# Patient Record
Sex: Male | Born: 1990 | Race: White | Hispanic: No | Marital: Single | Smoking: Current every day smoker
Health system: Southern US, Community
[De-identification: ages and names within clinical notes are randomized; demographics above are authoritative.]

## PROBLEM LIST (undated history)

## (undated) DIAGNOSIS — N200 Calculus of kidney: Secondary | ICD-10-CM

---
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
## 2018-07-15 ENCOUNTER — Ambulatory Visit
Admission: EM | Admit: 2018-07-15 | Discharge: 2018-07-15 | Disposition: A | Discharge status: Home / Self Care | Payer: Self-pay | Attending: Family Medicine | Admitting: Family Medicine

## 2018-07-15 ENCOUNTER — Encounter: Payer: Self-pay | Admitting: Emergency Medicine

## 2018-07-15 ENCOUNTER — Other Ambulatory Visit: Payer: Self-pay

## 2018-07-15 DIAGNOSIS — L732 Hidradenitis suppurativa: Secondary | ICD-10-CM

## 2018-07-15 DIAGNOSIS — L409 Psoriasis, unspecified: Secondary | ICD-10-CM

## 2018-07-15 HISTORY — DX: Calculus of kidney: N20.0

## 2018-07-15 MED ORDER — BETAMETHASONE DIPROPIONATE 0.05 % EX OINT: TOPICAL_OINTMENT | Freq: Two times a day (BID) | CUTANEOUS | 0 refills | Status: AC

## 2018-07-15 MED ORDER — DOXYCYCLINE HYCLATE 100 MG PO CAPS: 100.0000 mg | ORAL_CAPSULE | Freq: Two times a day (BID) | ORAL | 0 refills | Status: AC

## 2018-07-15 NOTE — ED Triage Notes (Signed)
Patient c/o rash under his right arm x 5 months. States the rash is painful and is draining yellow drainage. He also c/o rash to his left lower leg x 2 years and abdomen that just started within the last month.

## 2018-07-15 NOTE — Discharge Instructions (Signed)
Take the oral antibiotic as prescribed.  Use the topical as prescribed.  Consider seeing Dermatology (I recommend [facility]).  Take care  Dr. Adriana Simas

## 2018-07-15 NOTE — ED Provider Notes (Signed)
[facility]    CSN: [id number] Arrival date & time: 07/15/18  4098  History   Chief Complaint Chief Complaint  Patient presents with  . Rash    HPI  27 year old male presents with frequent boils in his axilla.  Additionally, patient complains of rash.  Both of these conditions are chronic.  Patient reports boils in the axilla.  This is chronic.  Has been going on for months.  The right axilla is currently quite bothersome.  He has areas which are red, and draining.  Also affects the left axilla.  Additionally, patient has several areas of erythema and scaly rash.  Has a prior diagnosis of psoriasis.  Was previously treated.  Does not appear to have followed up with [facility] dermatology.  No fever or chills.  No other associated symptoms.  No other complaints.  History reviewed and updated as below. Past Medical History:  Diagnosis Date  . Kidney stone   Psoriasis  History reviewed. No pertinent surgical history.   Home Medications    Prior to Admission medications   Medication Sig Start Date End Date Taking? Authorizing Provider  betamethasone dipropionate (DIPROLENE) 0.05 % ointment Apply topically 2 (two) times daily. 07/15/18   Tommie Sams, DO  doxycycline (VIBRAMYCIN) 100 MG capsule Take 1 capsule (100 mg total) by mouth 2 (two) times daily for 14 days. 07/15/18 07/29/18  Tommie Sams, DO   Social History Social History   Tobacco Use  . Smoking status: Current Every Day Smoker  . Smokeless tobacco: Never Used  Substance Use Topics  . Alcohol use: Yes  . Drug use: Yes    Comment: states he takes pain pills occasionally   Allergies   Sulfa antibiotics   Review of Systems Review of Systems  Constitutional: Negative for fever.  Skin: Positive for rash.       Abscesses (axilla)   Physical Exam Triage Vital Signs ED Triage Vitals  Enc Vitals Group     BP 07/15/18 0834 (!) 151/109     Pulse Rate 07/15/18 0834 (!) 121     Resp 07/15/18 0834 16     Temp  07/15/18 0834 98.5 F (36.9 C)     Temp Source 07/15/18 0834 Oral     SpO2 07/15/18 0834 96 %     Weight 07/15/18 0830 183 lb 3.2 oz (83.1 kg)     Height 07/15/18 0830 5\' 9"  (1.753 m)     Head Circumference --      Peak Flow --      Pain Score 07/15/18 0830 6     Pain Loc --      Pain Edu? --      Excl. in GC? --    Updated Vital Signs BP (!) 151/109 (BP Location: Left Arm)   Pulse (!) 121   Temp 98.5 F (36.9 C) (Oral)   Resp 16   Ht 5\' 9"  (1.753 m)   Wt 83.1 kg   SpO2 96%   BMI 27.05 kg/m   Visual Acuity Right Eye Distance:   Left Eye Distance:   Bilateral Distance:    Right Eye Near:   Left Eye Near:    Bilateral Near:     Physical Exam  Constitutional: He is oriented to person, place, and time. He appears well-developed. No distress.  Pulmonary/Chest: Effort normal. No respiratory distress.  Neurological: He is alert and oriented to person, place, and time.  Skin:  Right axilla -patient has multiple raised areas  of erythema and fluctuance.  Scarring noted.  This appears to be a chronic process.  Patient appears to have sinus tracts.  Left axilla -no active fluctuance or drainage at this time.  Scarring noted.  Additionally, patient has multiple erythematous scaly plaques -left lower leg, abdomen, back.  Psychiatric: His behavior is normal.  Flat affect.  Nursing note and vitals reviewed.  UC Treatments / Results  Labs (all labs ordered are listed, but only abnormal results are displayed) Labs Reviewed - No data to display  EKG None  Radiology No results found.  Procedures Procedures (including critical care time)  Medications Ordered in UC Medications - No data to display  Initial Impression / Assessment and Plan / UC Course  I have reviewed the triage vital signs and the nursing notes.  Pertinent labs & imaging results that were available during my care of the patient were reviewed by me and considered in my medical decision making (see chart  for details).    27 year old male presents with hidradenitis.  Placing on doxycycline.  Advised that he needs to see a Careers adviser.  Additionally, patient has ongoing psoriasis.  Topical steroids given.  Needs to see dermatology.  Final Clinical Impressions(s) / UC Diagnoses   Final diagnoses:  Psoriasis  Hidradenitis suppurativa     Discharge Instructions     Take the oral antibiotic as prescribed.  Use the topical as prescribed.  Consider seeing Dermatology (I recommend [facility]).  Take care  Dr. Adriana Simas     ED Prescriptions    Medication Sig Dispense Auth. Provider   doxycycline (VIBRAMYCIN) 100 MG capsule Take 1 capsule (100 mg total) by mouth 2 (two) times daily for 14 days. 28 capsule Virgil Slinger G, DO   betamethasone dipropionate (DIPROLENE) 0.05 % ointment Apply topically 2 (two) times daily. 30 g Tommie Sams, DO     Controlled Substance Prescriptions [place] Controlled Substance Registry consulted? Not Applicable   Tommie Sams, [place] 07/15/18 [id number]

## 2018-07-23 ENCOUNTER — Ambulatory Visit: Payer: Self-pay | Admitting: Surgery
